# Patient Record
Sex: Male | Born: 1960 | Race: Black or African American | Hispanic: No | Smoking: Heavy tobacco smoker
Health system: Southern US, Community
[De-identification: ages and names within clinical notes are randomized; demographics above are authoritative.]

## PROBLEM LIST (undated history)

## (undated) DIAGNOSIS — I10 Essential (primary) hypertension: Secondary | ICD-10-CM

## (undated) HISTORY — PX: NO PAST SURGERIES: SHX2092

---

## 2017-10-21 ENCOUNTER — Emergency Department (HOSPITAL_COMMUNITY)

## 2017-10-21 ENCOUNTER — Encounter (HOSPITAL_COMMUNITY): Payer: Self-pay | Admitting: Emergency Medicine

## 2017-10-21 ENCOUNTER — Observation Stay (HOSPITAL_COMMUNITY)
Admission: EM | Admit: 2017-10-21 | Discharge: 2017-10-22 | Disposition: A | Discharge status: Home / Self Care | Attending: Family Medicine | Admitting: Family Medicine

## 2017-10-21 ENCOUNTER — Other Ambulatory Visit: Payer: Self-pay

## 2017-10-21 DIAGNOSIS — R072 Precordial pain: Secondary | ICD-10-CM

## 2017-10-21 DIAGNOSIS — I160 Hypertensive urgency: Secondary | ICD-10-CM | POA: Diagnosis not present

## 2017-10-21 DIAGNOSIS — Z7982 Long term (current) use of aspirin: Secondary | ICD-10-CM | POA: Diagnosis not present

## 2017-10-21 DIAGNOSIS — I10 Essential (primary) hypertension: Secondary | ICD-10-CM | POA: Diagnosis not present

## 2017-10-21 DIAGNOSIS — R079 Chest pain, unspecified: Secondary | ICD-10-CM | POA: Diagnosis present

## 2017-10-21 DIAGNOSIS — F17210 Nicotine dependence, cigarettes, uncomplicated: Secondary | ICD-10-CM | POA: Diagnosis not present

## 2017-10-21 DIAGNOSIS — Z79899 Other long term (current) drug therapy: Secondary | ICD-10-CM | POA: Insufficient documentation

## 2017-10-21 HISTORY — DX: Essential (primary) hypertension: I10

## 2017-10-21 LAB — BASIC METABOLIC PANEL
Anion gap: 6 (ref 5–15)
BUN: 12 mg/dL (ref 6–20)
CHLORIDE: 105 mmol/L (ref 101–111)
CO2: 31 mmol/L (ref 22–32)
CREATININE: 1.1 mg/dL (ref 0.61–1.24)
Calcium: 9.7 mg/dL (ref 8.9–10.3)
GFR calc Af Amer: >60 mL/min (ref >60)
GFR calc non Af Amer: >60 mL/min (ref >60)
GLUCOSE: 101 mg/dL — AB (ref 65–99)
POTASSIUM: 3.9 mmol/L (ref 3.5–5.1)
SODIUM: 142 mmol/L (ref 135–145)

## 2017-10-21 LAB — CBC
HEMATOCRIT: 41.2 % (ref 39.0–52.0)
Hemoglobin: 13.2 g/dL (ref 13.0–17.0)
MCH: 28.1 pg (ref 26.0–34.0)
MCHC: 32 g/dL (ref 30.0–36.0)
MCV: 87.8 fL (ref 78.0–100.0)
PLATELETS: 196 10*3/uL (ref 150–400)
RBC: 4.69 MIL/uL (ref 4.22–5.81)
RDW: 12.2 % (ref 11.5–15.5)
WBC: 5.4 10*3/uL (ref 4.0–10.5)

## 2017-10-21 LAB — TROPONIN I
Troponin I: <0.03 ng/mL (ref <0.03)
Troponin I: <0.03 ng/mL (ref <0.03)
Troponin I: <0.03 ng/mL (ref <0.03)

## 2017-10-21 MED ORDER — ONDANSETRON HCL 4 MG/2ML IJ SOLN
4.0000 mg | Freq: Once | INTRAMUSCULAR | Status: AC
  Administered 2017-10-21: 4 mg via INTRAVENOUS
  Filled 2017-10-21: qty 2

## 2017-10-21 MED ORDER — ONDANSETRON HCL 4 MG/2ML IJ SOLN: 4.0000 mg | Freq: Four times a day (QID) | INTRAMUSCULAR | Status: DC | PRN

## 2017-10-21 MED ORDER — HEPARIN SODIUM (PORCINE) 5000 UNIT/ML IJ SOLN
5000.0000 [IU] | Freq: Three times a day (TID) | INTRAMUSCULAR | Status: DC
  Administered 2017-10-21 – 2017-10-22 (×3): 5000 [IU] via SUBCUTANEOUS
  Filled 2017-10-21 (×3): qty 1

## 2017-10-21 MED ORDER — LABETALOL HCL 5 MG/ML IV SOLN
20.0000 mg | Freq: Once | INTRAVENOUS | Status: AC
  Administered 2017-10-21: 20 mg via INTRAVENOUS
  Filled 2017-10-21: qty 4

## 2017-10-21 MED ORDER — ASPIRIN EC 325 MG PO TBEC
325.0000 mg | DELAYED_RELEASE_TABLET | Freq: Every day | ORAL | Status: DC
  Administered 2017-10-22: 325 mg via ORAL
  Filled 2017-10-21: qty 1

## 2017-10-21 MED ORDER — ACETAMINOPHEN 325 MG PO TABS: 650.0000 mg | ORAL_TABLET | ORAL | Status: DC | PRN

## 2017-10-21 MED ORDER — NITROGLYCERIN 2 % TD OINT: 1.0000 [in_us] | TOPICAL_OINTMENT | Freq: Once | TRANSDERMAL | Status: DC

## 2017-10-21 MED ORDER — GI COCKTAIL ~~LOC~~
30.0000 mL | Freq: Four times a day (QID) | ORAL | Status: DC | PRN
  Filled 2017-10-21: qty 30

## 2017-10-21 MED ORDER — NITROGLYCERIN 0.4 MG SL SUBL: 0.4000 mg | SUBLINGUAL_TABLET | SUBLINGUAL | Status: DC | PRN

## 2017-10-21 MED ORDER — HYDRALAZINE HCL 20 MG/ML IJ SOLN: 5.0000 mg | INTRAMUSCULAR | Status: DC | PRN

## 2017-10-21 MED ORDER — MORPHINE SULFATE (PF) 2 MG/ML IV SOLN
2.0000 mg | INTRAVENOUS | Status: DC | PRN
  Administered 2017-10-21 – 2017-10-22 (×2): 2 mg via INTRAVENOUS
  Filled 2017-10-21 (×2): qty 1

## 2017-10-21 MED ORDER — MORPHINE SULFATE (PF) 4 MG/ML IV SOLN
4.0000 mg | Freq: Once | INTRAVENOUS | Status: AC
  Administered 2017-10-21: 4 mg via INTRAVENOUS
  Filled 2017-10-21: qty 1

## 2017-10-21 NOTE — H&P (Signed)
History and Physical    William Randall ZOX:[id number] DOB: 13-Apr-1961 DOA: 10/21/2017  PCP: Patient, No Pcp Per  Patient [facility] from: [facility]  Chief Complaint: Chest pain  HPI: William Randall is a 56 y.o. male with medical history significant of HTN, presents for chest pain.  This morning around 2am he was woken from sleep secondary to this pain.  Pain was located in the left chest, was nonradiating, scored 10/10, not relieved by anything.  Worsened by nothing as well. Never had chest pain like this before.  No nausea or vomiting, chills, fevers.  Patient does report shortness of breath with the chest pain.  Pain lasted until patient arrived in the ED and is still experiencing some pain.  Never seen a cardiologist in the past.     ED Course: Patient seen and evaluated in the emergency department.  He was found to be significantly hypertensive with systolic blood pressure of 214.  He was given IV labetalol, nitroglycerin, and morphine.  Initial troponin was less than 0.03.  Since chest pain subsided but given significant cardiac risk factors TR[facility] asked to admit to cycle enzymes and for further observation.  Review of Systems: As per HPI otherwise 10 point review of systems negative.    PMH: HTN, denies history of diabetes, hypo/hyper- thyroidism, stroke, kidney disease  History reviewed. No pertinent surgical history.   reports that he has been smoking cigarettes.  he has never used smokeless tobacco. He reports that he does not drink alcohol or use drugs.Patient reports he does not smoke anymore.  No Known Allergies  History reviewed. No pertinent family history.  Prior to Admission medications   Medication Sig Start Date End Date Taking? Authorizing Provider  LISINOPRIL PO Take 1 tablet by mouth daily.   Yes [provider]    Physical Exam: Vitals:   10/21/17 1300 10/21/17 1330 10/21/17 1400 10/21/17 1430  BP: (!) 161/97 (!) 148/93 (!) 152/101 (!) 158/96  Pulse: 70 64  67 65  Resp: 20 16 17 16   Temp:      TempSrc:      SpO2: 97% 94% 97% 94%  Weight:      Height:          Constitutional: NAD, calm, comfortable Vitals:   10/21/17 1300 10/21/17 1330 10/21/17 1400 10/21/17 1430  BP: (!) 161/97 (!) 148/93 (!) 152/101 (!) 158/96  Pulse: 70 64 67 65  Resp: 20 16 17 16   Temp:      TempSrc:      SpO2: 97% 94% 97% 94%  Weight:      Height:       Eyes: PERRL, lids and conjunctivae normal ENMT: Mucous membranes are moist. Posterior pharynx clear of any exudate or lesions.Normal dentition.  Neck: normal, supple, no masses, no thyromegaly Respiratory: clear to auscultation bilaterally, no wheezing, no crackles. Normal respiratory effort. No accessory muscle use.  Cardiovascular: Tenderness on the left chest wall on palpation, regular rate and rhythm, no murmurs / rubs / gallops. No extremity edema. 2+ pedal pulses. No carotid bruits.  Abdomen: no tenderness, no masses palpated. No hepatosplenomegaly. Bowel sounds positive.  Musculoskeletal: no clubbing / cyanosis. No joint deformity upper and lower extremities. Good ROM, no contractures. Normal muscle tone.  Skin: no rashes, lesions, ulcers. No induration Neurologic: CN 2-12 grossly intact. Sensation intact, DTR normal. Strength 5/5 in all 4.  Psychiatric: Normal judgment and insight. Alert and oriented x 3. Normal mood.    Labs on Admission: I have personally  reviewed following labs and imaging studies  CBC: Recent Labs  Lab 10/21/17 1202  WBC 5.4  HGB 13.2  HCT 41.2  MCV 87.8  PLT 196   Basic Metabolic Panel: Recent Labs  Lab 10/21/17 1202  NA 142  K 3.9  CL 105  CO2 31  GLUCOSE 101*  BUN 12  CREATININE 1.10  CALCIUM 9.7   GFR: Estimated Creatinine Clearance: 93.1 mL/min (by C-G formula based on SCr of 1.1 mg/dL). Liver Function Tests: No results for input(s): AST, ALT, ALKPHOS, BILITOT, PROT, ALBUMIN in the last 168 hours. No results for input(s): LIPASE, AMYLASE in the last 168  hours. No results for input(s): AMMONIA in the last 168 hours. Coagulation Profile: No results for input(s): INR, PROTIME in the last 168 hours. Cardiac Enzymes: Recent Labs  Lab 10/21/17 1202  TROPONINI <0.03   BNP (last 3 results) No results for input(s): PROBNP in the last 8760 hours. HbA1C: No results for input(s): HGBA1C in the last 72 hours. CBG: No results for input(s): GLUCAP in the last 168 hours. Lipid Profile: No results for input(s): CHOL, HDL, LDLCALC, TRIG, CHOLHDL, LDLDIRECT in the last 72 hours. Thyroid Function Tests: No results for input(s): TSH, T4TOTAL, FREET4, T3FREE, THYROIDAB in the last 72 hours. Anemia Panel: No results for input(s): VITAMINB12, FOLATE, FERRITIN, TIBC, IRON, RETICCTPCT in the last 72 hours. Urine analysis: No results found for: COLORURINE, APPEARANCEUR, LABSPEC, PHURINE, GLUCOSEU, HGBUR, BILIRUBINUR, KETONESUR, PROTEINUR, UROBILINOGEN, NITRITE, LEUKOCYTESUR Sepsis Labs: !!!!!!!!!!!!!!!!!!!!!!!!!!!!!!!!!!!!!!!!!!!! @LABRCNTIP (procalcitonin:4,lacticidven:4) )No results found for this or any previous visit (from the past 240 hour(s)).   Radiological Exams on Admission: Dg Chest Portable 1 View  Result Date: 10/21/2017 CLINICAL DATA:  Chest pain EXAM: PORTABLE CHEST 1 VIEW COMPARISON:  None. FINDINGS: Cardiac shadow is mildly accentuated by the portable technique. The lungs are well aerated bilaterally. No focal infiltrate or sizable effusion is seen. No bony abnormality is noted. IMPRESSION: No active disease. Electronically Signed   By: Alcide CleverMark  Lukens M.D.   On: 10/21/2017 13:13    EKG: Independently reviewed.  Sinus rhythm  Assessment/Plan Principal Problem:   Chest pain Active Problems:   HTN (hypertension)     Chest pain -Cycle troponins -Telemetry - Fasting lipid panel in a.m. -Nitroglycerin as needed chest pain - Apparently patient received aspirin in the emergency department although no aspirin is documented on medication  list -EKGs with cardiac enzymes  Hypertension -Patient is unclear as to what medications he is taking in prison, thinks it is lisinopril but is unclear of the strength - Blood pressure responded to IV labetalol - IV hydralazine ordered as needed for systolic blood pressures greater than 180   DVT prophylaxis: Heparin  Code Status: Full code  Family Communication: No family is bedside, however two state trooper's are bedside  Disposition Plan: Likely patient will be Discharged back to jail in a.m.  Consults called: None  Admission status: Observation, telemetry   Katrinka BlazingAlex U Kadolph MD [facility] Pager [phone]  If 7PM-7AM, please contact night-coverage [email or web address] Password [email or web address]  10/21/2017, 2:59 PM

## 2017-10-21 NOTE — ED Provider Notes (Signed)
[facility] EMERGENCY DEPARTMENT Provider Note   CSN: [id number] Arrival date & time: 10/21/17  1121     History   Chief Complaint Chief Complaint  Patient presents with  . Chest Pain    HPI William Randall is a 56 y.o. male.  HPI Patient presents to the emergency room for evaluation of left-sided chest pain.  Patient states the symptoms started this morning.  He began having a constant sharp pain in the left side of his chest.  Pain does not radiate.  He has felt short of breath but denies any nausea.  Patient has a history of hypertension that sometimes is very poorly controlled but denies any history of heart disease or lung disease. History reviewed. No pertinent past medical history.  There are no active problems to display for this patient.   History reviewed. No pertinent surgical history.     Home Medications    Prior to Admission medications   Not on File    Family History No family history on file.  Social History Social History   Tobacco Use  . Smoking status: Heavy Tobacco Smoker    Types: Cigarettes  . Smokeless tobacco: Never Used  Substance Use Topics  . Alcohol use: No    Frequency: Never  . Drug use: No     Allergies   Patient has no known allergies.   Review of Systems Review of Systems  All other systems reviewed and are negative.    Physical Exam Updated Vital Signs BP (!) 162/95   Pulse 77   Temp 98.5 F (36.9 C) (Oral)   Resp 19   Ht 1.753 m (5\' 9" )   Wt 113.4 kg (250 lb)   SpO2 96%   BMI 36.92 kg/m   Physical Exam  Constitutional: He appears well-developed and well-nourished. No distress.  HENT:  Head: Normocephalic and atraumatic.  Right Ear: External ear normal.  Left Ear: External ear normal.  Eyes: Conjunctivae are normal. Right eye exhibits no discharge. Left eye exhibits no discharge. No scleral icterus.  Neck: Neck supple. No tracheal deviation present.  Cardiovascular: Normal rate, regular rhythm and  intact distal pulses.  Pulmonary/Chest: Effort normal and breath sounds normal. No stridor. No respiratory distress. He has no wheezes. He has no rales.  Abdominal: Soft. Bowel sounds are normal. He exhibits no distension. There is no tenderness. There is no rebound and no guarding.  Musculoskeletal: He exhibits no edema or tenderness.  Neurological: He is alert. He has normal strength. No cranial nerve deficit (no facial droop, extraocular movements intact, no slurred speech) or sensory deficit. He exhibits normal muscle tone. He displays no seizure activity. Coordination normal.  Skin: Skin is warm and dry. No rash noted.  Psychiatric: He has a normal mood and affect.  Nursing note and vitals reviewed.    ED Treatments / Results  Labs (all labs ordered are listed, but only abnormal results are displayed) Labs Reviewed  BASIC METABOLIC PANEL - Abnormal; Notable for the following components:      Result Value   Glucose, Bld 101 (*)    All other components within normal limits  CBC  TROPONIN I    EKG  EKG Interpretation  Date/Time:  Sunday October 21 2017 11:35:32 EST Ventricular Rate:  92 PR Interval:    QRS Duration: 85 QT Interval:  368 QTC Calculation: 456 R Axis:   25 Text Interpretation:  Sinus rhythm Minimal ST elevation, inferior leads No old tracing to compare Confirmed by  Linwood DibblesKnapp, Morrisa Aldaba ([id number]) on 10/21/2017 12:02:19 PM       Radiology Dg Chest Portable 1 View  Result Date: 10/21/2017 CLINICAL DATA:  Chest pain EXAM: PORTABLE CHEST 1 VIEW COMPARISON:  None. FINDINGS: Cardiac shadow is mildly accentuated by the portable technique. The lungs are well aerated bilaterally. No focal infiltrate or sizable effusion is seen. No bony abnormality is noted. IMPRESSION: No active disease. Electronically Signed   By: Alcide CleverMark  Lukens M.D.   On: 10/21/2017 13:13    Procedures Procedures (including critical care time)  Medications Ordered in ED Medications  morphine 4 MG/ML  injection 4 mg (4 mg Intravenous Given 10/21/17 1226)  labetalol (NORMODYNE,TRANDATE) injection 20 mg (20 mg Intravenous Given 10/21/17 1226)  ondansetron (ZOFRAN) injection 4 mg (4 mg Intravenous Given 10/21/17 1236)     Initial Impression / Assessment and Plan / ED Course  I have reviewed the triage vital signs and the nursing notes.  Pertinent labs & imaging results that were available during my care of the patient were reviewed by me and considered in my medical decision making (see chart for details).  Clinical Course as of Oct 21 1346  Sun Oct 21, 2017  1218 Pt was given NTG and has NTG paste on.  Applied by EMS  [JK]  1344 Patient states his pain has improved.  Blood pressure is now 162/95.  [JK]    Clinical Course User Index [JK] Linwood DibblesKnapp, Densel Kronick, MD    She presented to the emergency room with complaints of chest pain, and hypertension.  Patient does not have a history of heart disease.  But he does have cardiac risk factors are tobacco use and hypertension.  Patient has nonspecific EKG changes.  He was very hypertensive but has improved with medications.  Patient denies any pain in his back.  No signs of widened mediastinum.  I doubt aortic dissection.  He is at high risk for coronary artery disease.  Considering his symptoms, his degree of hypertension, we will consult with the medical service for admission and further monitoring.  Final Clinical Impressions(s) / ED Diagnoses   Final diagnoses:  Hypertensive urgency  Chest pain, unspecified type      Linwood DibblesKnapp, Khadijatou Borak, MD 10/21/17 1347

## 2017-10-21 NOTE — ED Triage Notes (Addendum)
[facility] Patient complains of chest pain.  Patient given 2 sublingual nirto and 1.5 inch of nitro paste to right upper chest. EMS reports patient still reading hypertensive. Patient given Aspirin 324 mg.  Patient states shortness of breath.

## 2017-10-22 DIAGNOSIS — I160 Hypertensive urgency: Secondary | ICD-10-CM | POA: Diagnosis not present

## 2017-10-22 DIAGNOSIS — I10 Essential (primary) hypertension: Secondary | ICD-10-CM | POA: Diagnosis not present

## 2017-10-22 DIAGNOSIS — R072 Precordial pain: Secondary | ICD-10-CM | POA: Diagnosis not present

## 2017-10-22 LAB — LIPID PANEL
CHOL/HDL RATIO: 5.6 ratio
CHOLESTEROL: 167 mg/dL (ref 0–200)
HDL: 30 mg/dL — ABNORMAL LOW (ref >40)
LDL Cholesterol: 113 mg/dL — ABNORMAL HIGH (ref 0–99)
Triglycerides: 122 mg/dL (ref <150)
VLDL: 24 mg/dL (ref 0–40)

## 2017-10-22 LAB — TROPONIN I: Troponin I: <0.03 ng/mL (ref <0.03)

## 2017-10-22 LAB — MRSA PCR SCREENING: MRSA BY PCR: NEGATIVE

## 2017-10-22 LAB — HIV ANTIBODY (ROUTINE TESTING W REFLEX): HIV SCREEN 4TH GENERATION: NONREACTIVE

## 2017-10-22 MED ORDER — ATORVASTATIN CALCIUM 10 MG PO TABS: 10.0000 mg | ORAL_TABLET | Freq: Every day | ORAL | 0 refills | Status: AC

## 2017-10-22 MED ORDER — ASPIRIN 325 MG PO TBEC: 325.0000 mg | DELAYED_RELEASE_TABLET | Freq: Every day | ORAL | 0 refills | Status: AC

## 2017-10-22 MED ORDER — HYDROCHLOROTHIAZIDE 25 MG PO TABS: 25.0000 mg | ORAL_TABLET | Freq: Every day | ORAL | Status: DC

## 2017-10-22 MED ORDER — NITROGLYCERIN 0.4 MG SL SUBL: 0.4000 mg | SUBLINGUAL_TABLET | SUBLINGUAL | 0 refills | Status: AC | PRN

## 2017-10-22 MED ORDER — LISINOPRIL 10 MG PO TABS: 10.0000 mg | ORAL_TABLET | Freq: Every day | ORAL | Status: DC

## 2017-10-22 MED ORDER — METOPROLOL TARTRATE 25 MG PO TABS: 25.0000 mg | ORAL_TABLET | Freq: Two times a day (BID) | ORAL | Status: DC

## 2017-10-22 NOTE — Discharge Summary (Addendum)
Physician Discharge Summary  William Randall [id number] DOB: [birth year] DOA: 10/21/2017  PCP: Patient, No Pcp Per  Admit date: 10/21/2017 Discharge date: 10/22/2017  Admitted From: Jail Disposition: Jail  Recommendations for Outpatient Follow-up:  1. Blood pressure monitoring by prison physician 2. Start cholesterol medication 3. Stop Indomethocin 4. Consider outpatient follow-up with cardiology for further evaluation  Home Health: None Equipment/Devices: None  Discharge Condition: Stable CODE STATUS: Full code Diet recommendation: Heart healthy  Brief/Interim Summary: William Randall is a 56 y.o. male with medical history significant of HTN, presents for chest pain.  This morning around 2am he was woken from sleep secondary to this pain.  Pain was located in the left chest, was nonradiating, scored 10/10, not relieved by anything.  Worsened by nothing as well. Never had chest pain like this before.  No nausea or vomiting, chills, fevers.  Patient does report shortness of breath with the chest pain.  Pain lasted until patient arrived in the ED and is still experiencing some pain.  Never seen a cardiologist in the past.     ED Course: Patient seen and evaluated in the emergency department.  He was found to be significantly hypertensive with systolic blood pressure of 214.  He was given IV labetalol, nitroglycerin, and morphine.  Initial troponin was less than 0.03.  Since chest pain subsided but given significant cardiac risk factors TRH asked to admit to cycle enzymes and for further observation.  Patient was admitted for overnight observation.  Cardiac enzymes were cycled and were all negative.  Patient reported improvement in his chest pain on 10/22/2017.  He was stable for discharge.  He was instructed to start a statin for his hyper lipidemia as well as continue his blood pressure medication and be monitored by the prison physician.  He was told if he were to have further  chest pain to return to the hospital but otherwise to consider outpatient follow-up with cardiology for further evaluation.  His blood pressure was controlled at time of discharge.  Discussed with MD at facility patient is actually on 3 blood pressure medications which include hydrochlorothiazide 25 mg daily, lisinopril 10 mg daily, metoprolol 25 mg twice daily.  He will be discharged on those medications and will have his blood pressure medications titrated at the "work form".  He is also on indomethacin outpatient and this can be discontinued.  Discharge Diagnoses:  Principal Problem:   Chest pain Active Problems:   HTN (hypertension)    Discharge Instructions  Discharge Instructions    Call MD for:  difficulty breathing, headache or visual disturbances   Complete by:  As directed    Call MD for:  extreme fatigue   Complete by:  As directed    Call MD for:  hives   Complete by:  As directed    Call MD for:  persistant dizziness or light-headedness   Complete by:  As directed    Call MD for:  persistant nausea and vomiting   Complete by:  As directed    Call MD for:  severe uncontrolled pain   Complete by:  As directed    Call MD for:  temperature >100.4   Complete by:  As directed    Diet - low sodium heart healthy   Complete by:  As directed    Discharge instructions   Complete by:  As directed    Set up an appointment outpatient with cardiology for further evaluation Control blood pressure Monitor blood pressure twice a  day for the next 5 days   Increase activity slowly   Complete by:  As directed      Allergies as of 10/22/2017   No Known Allergies     Medication List    TAKE these medications   aspirin 325 MG EC tablet Take 1 tablet (325 mg total) by mouth daily.   atorvastatin 10 MG tablet Commonly known as:  LIPITOR Take 1 tablet (10 mg total) by mouth daily.   LISINOPRIL PO Take 1 tablet by mouth daily.   nitroGLYCERIN 0.4 MG SL tablet Commonly known  as:  NITROSTAT Place 1 tablet (0.4 mg total) under the tongue every 5 (five) minutes as needed for up to 10 days for chest pain.       No Known Allergies  Consultations:  None   Procedures/Studies: Dg Chest Portable 1 View  Result Date: 10/21/2017 CLINICAL DATA:  Chest pain EXAM: PORTABLE CHEST 1 VIEW COMPARISON:  None. FINDINGS: Cardiac shadow is mildly accentuated by the portable technique. The lungs are well aerated bilaterally. No focal infiltrate or sizable effusion is seen. No bony abnormality is noted. IMPRESSION: No active disease. Electronically Signed   By: Alcide CleverMark  Lukens M.D.   On: 10/21/2017 13:13   Subjective: She reports he feels significantly better.  He is asking to discharge.  He denies any further chest pain.  Discharge Exam: Vitals:   10/22/17 0329 10/22/17 0800  BP: (!) 166/84 139/78  Pulse: 74 69  Resp: 20 19  Temp: 97.9 F (36.6 C) 98 F (36.7 C)  SpO2: 100% 100%   Vitals:   10/21/17 1929 10/21/17 2329 10/22/17 0329 10/22/17 0800  BP: (!) 174/104 (!) 148/77 (!) 166/84 139/78  Pulse: 66 60 74 69  Resp: 18 18 20 19   Temp: 98.1 F (36.7 C) 97.9 F (36.6 C) 97.9 F (36.6 C) 98 F (36.7 C)  TempSrc: Oral Oral Oral Oral  SpO2: 99% 96% 100% 100%  Weight:      Height:        General: Pt is alert, awake, not in acute distress Cardiovascular: RRR, S1/S2 +, no rubs, no gallops Respiratory: CTA bilaterally, no wheezing, no rhonchi Abdominal: Soft, NT, ND, bowel sounds + Extremities: no edema, no cyanosis    The results of significant diagnostics from this hospitalization (including imaging, microbiology, ancillary and laboratory) are listed below for reference.     Microbiology: Recent Results (from the past 240 hour(s))  MRSA PCR Screening     Status: None   Collection Time: 10/21/17  7:33 PM  Result Value Ref Range Status   MRSA by PCR NEGATIVE NEGATIVE Final    Comment:        The GeneXpert MRSA Assay (FDA approved for NASAL  specimens only), is one component of a comprehensive MRSA colonization surveillance program. It is not intended to diagnose MRSA infection nor to guide or monitor treatment for MRSA infections.      Labs: BNP (last 3 results) No results for input(s): BNP in the last 8760 hours. Basic Metabolic Panel: Recent Labs  Lab 10/21/17 1202  NA 142  K 3.9  CL 105  CO2 31  GLUCOSE 101*  BUN 12  CREATININE 1.10  CALCIUM 9.7   Liver Function Tests: No results for input(s): AST, ALT, ALKPHOS, BILITOT, PROT, ALBUMIN in the last 168 hours. No results for input(s): LIPASE, AMYLASE in the last 168 hours. No results for input(s): AMMONIA in the last 168 hours. CBC: Recent Labs  Lab 10/21/17  1202  WBC 5.4  HGB 13.2  HCT 41.2  MCV 87.8  PLT 196   Cardiac Enzymes: Recent Labs  Lab 10/21/17 1202 10/21/17 1615 10/21/17 2233 10/22/17 0425  TROPONINI <0.03 <0.03 <0.03 <0.03   BNP: Invalid input(s): POCBNP CBG: No results for input(s): GLUCAP in the last 168 hours. D-Dimer No results for input(s): DDIMER in the last 72 hours. Hgb A1c No results for input(s): HGBA1C in the last 72 hours. Lipid Profile Recent Labs    10/22/17 0426  CHOL 167  HDL 30*  LDLCALC 113*  TRIG 122  CHOLHDL 5.6   Thyroid function studies No results for input(s): TSH, T4TOTAL, T3FREE, THYROIDAB in the last 72 hours.  Invalid input(s): FREET3 Anemia work up No results for input(s): VITAMINB12, FOLATE, FERRITIN, TIBC, IRON, RETICCTPCT in the last 72 hours. Urinalysis No results found for: COLORURINE, APPEARANCEUR, LABSPEC, PHURINE, GLUCOSEU, HGBUR, BILIRUBINUR, KETONESUR, PROTEINUR, UROBILINOGEN, NITRITE, LEUKOCYTESUR Sepsis Labs Invalid input(s): PROCALCITONIN,  WBC,  LACTICIDVEN Microbiology Recent Results (from the past 240 hour(s))  MRSA PCR Screening     Status: None   Collection Time: 10/21/17  7:33 PM  Result Value Ref Range Status   MRSA by PCR NEGATIVE NEGATIVE Final    Comment:         The GeneXpert MRSA Assay (FDA approved for NASAL specimens only), is one component of a comprehensive MRSA colonization surveillance program. It is not intended to diagnose MRSA infection nor to guide or monitor treatment for MRSA infections.      Time coordinating discharge: 25 minutes  SIGNED:   Katrinka Blazing, MD  [facility] 10/22/2017, 11:12 AM Pager [phone] If 7PM-7AM, please contact night-coverage [email or web address] Password [email or web address]

## 2017-10-22 NOTE — Progress Notes (Signed)
Pt discharged in stable condition into the care of armed guard.  PIV removed intact w/o S&S of complications.  Discharge instructions reviewed with pt.  Pt verbalized understanding.

## 2017-10-23 LAB — HEMOGLOBIN A1C
HEMOGLOBIN A1C: 5 % (ref 4.8–5.6)
MEAN PLASMA GLUCOSE: 97 mg/dL

## 2019-07-11 IMAGING — CR DG CHEST 1V PORT
1 series · 1 of 1 positions shown · non-contrast
Comparison: None.

CLINICAL DATA: Chest pain

EXAM:
PORTABLE CHEST 1 VIEW

[portable]
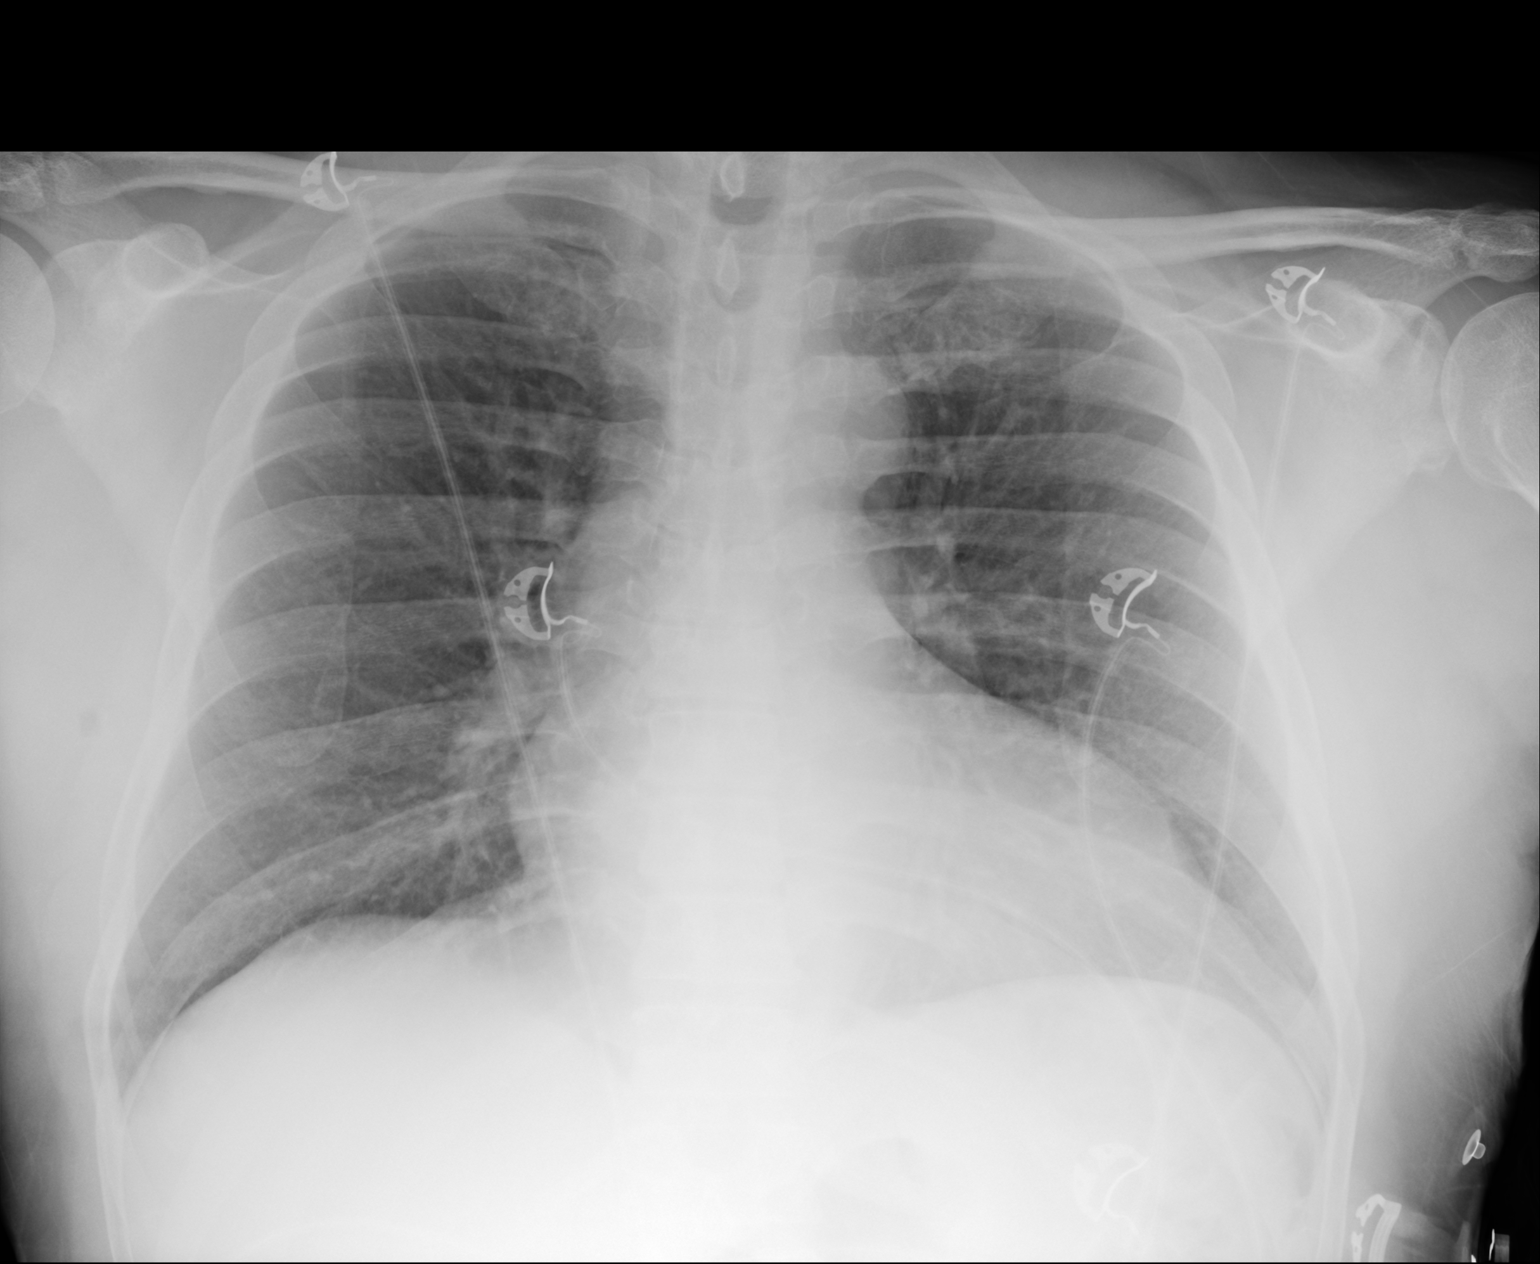

[1 of 1 positions shown; findings below may reference images not displayed]

FINDINGS: Cardiac shadow is mildly accentuated by the portable technique. The
lungs are well aerated bilaterally. No focal infiltrate or sizable
effusion is seen. No bony abnormality is noted.
IMPRESSION: No active disease.
